# Patient Record
Sex: Male | Born: 1980 | Race: Black or African American | Hispanic: No | Marital: Married
Health system: Southern US, Community
[De-identification: ages and names within clinical notes are randomized; demographics above are authoritative.]

---

## 2005-06-24 ENCOUNTER — Emergency Department (HOSPITAL_COMMUNITY): Admission: EM | Admit: 2005-06-24 | Discharge: 2005-06-25 | Payer: Self-pay | Admitting: Emergency Medicine

## 2008-10-18 ENCOUNTER — Emergency Department (HOSPITAL_COMMUNITY): Admission: EM | Admit: 2008-10-18 | Discharge: 2008-10-18 | Payer: Self-pay | Admitting: Emergency Medicine

## 2013-01-22 ENCOUNTER — Ambulatory Visit
Admission: RE | Admit: 2013-01-22 | Discharge: 2013-01-22 | Disposition: A | Discharge status: Home / Self Care | Payer: No Typology Code available for payment source | Source: Ambulatory Visit | Attending: Infectious Disease | Admitting: Infectious Disease

## 2013-01-22 ENCOUNTER — Other Ambulatory Visit: Payer: Self-pay | Admitting: Infectious Disease

## 2013-01-22 DIAGNOSIS — A150 Tuberculosis of lung: Secondary | ICD-10-CM

## 2015-09-07 ENCOUNTER — Other Ambulatory Visit: Payer: Self-pay | Admitting: Infectious Disease

## 2015-09-07 ENCOUNTER — Ambulatory Visit
Admission: RE | Admit: 2015-09-07 | Discharge: 2015-09-07 | Disposition: A | Discharge status: Home / Self Care | Payer: No Typology Code available for payment source | Source: Ambulatory Visit | Attending: Infectious Disease | Admitting: Infectious Disease

## 2015-09-07 DIAGNOSIS — R7611 Nonspecific reaction to tuberculin skin test without active tuberculosis: Secondary | ICD-10-CM

## 2016-02-10 ENCOUNTER — Encounter (HOSPITAL_COMMUNITY): Payer: Self-pay | Admitting: Emergency Medicine

## 2016-02-10 ENCOUNTER — Emergency Department (HOSPITAL_COMMUNITY)
Admission: EM | Admit: 2016-02-10 | Discharge: 2016-02-11 | Disposition: A | Discharge status: Home / Self Care | Payer: Self-pay | Attending: Emergency Medicine | Admitting: Emergency Medicine

## 2016-02-10 DIAGNOSIS — Y999 Unspecified external cause status: Secondary | ICD-10-CM | POA: Insufficient documentation

## 2016-02-10 DIAGNOSIS — S01112A Laceration without foreign body of left eyelid and periocular area, initial encounter: Secondary | ICD-10-CM | POA: Insufficient documentation

## 2016-02-10 DIAGNOSIS — Y929 Unspecified place or not applicable: Secondary | ICD-10-CM | POA: Insufficient documentation

## 2016-02-10 DIAGNOSIS — W500XXA Accidental hit or strike by another person, initial encounter: Secondary | ICD-10-CM | POA: Insufficient documentation

## 2016-02-10 DIAGNOSIS — Z23 Encounter for immunization: Secondary | ICD-10-CM | POA: Insufficient documentation

## 2016-02-10 DIAGNOSIS — Y9367 Activity, basketball: Secondary | ICD-10-CM | POA: Insufficient documentation

## 2016-02-10 MED ORDER — TETANUS-DIPHTH-ACELL PERTUSSIS 5-2.5-18.5 LF-MCG/0.5 IM SUSP
0.5000 mL | Freq: Once | INTRAMUSCULAR | Status: AC
  Administered 2016-02-10: 0.5 mL via INTRAMUSCULAR
  Filled 2016-02-10: qty 0.5

## 2016-02-10 MED ORDER — LIDOCAINE-EPINEPHRINE-TETRACAINE (LET) SOLUTION
3.0000 mL | Freq: Once | NASAL | Status: AC
  Administered 2016-02-10: 3 mL via TOPICAL
  Filled 2016-02-10: qty 3

## 2016-02-10 NOTE — ED Triage Notes (Signed)
Pt states that he got head-butted playing basketball tonight and now has a lac above his L eye. Last tetanus in approx. 2010. Alert and oriented. Bleeding controlled.

## 2016-02-11 NOTE — ED Provider Notes (Signed)
[facility]-EMERGENCY DEPT Provider Note   CSN: [id number] Arrival date & time: 02/10/16  2213     History   Chief Complaint Chief Complaint  Patient presents with  . Facial Laceration    HPI Wesley Arellano is a 35 y.o. male.  HPI     Pt is a 35 y/o male presents to the ER with left eyebrow laceration that occurred just PTA when he was playing basketball with friends when he accidentally bumped heads with a friend.  He states the impact was mild, he had no pain, and only realized he had an injury because he felt the warm blood on his face.  He denies LOC.  Denies any other injury or pain.   Bleeding controlled at presentation.  No swelling, eye pain, blurry vision.  History reviewed. No pertinent past medical history.  There are no active problems to display for this patient.   No past surgical history on file.     Home Medications    Prior to Admission medications   Not on File    Family History History reviewed. No pertinent family history.  Social History Social History  Substance Use Topics  . Smoking status: Not on file  . Smokeless tobacco: Not on file  . Alcohol use Not on file     Allergies   Review of patient's allergies indicates no known allergies.   Review of Systems Review of Systems  All other systems reviewed and are negative.    Physical Exam Updated Vital Signs BP 146/91 (BP Location: Right Arm)   Pulse 102   Temp 98.6 F (37 C) (Oral)   Resp 18   SpO2 99%   Physical Exam  Constitutional: He is oriented to person, place, and time. He appears well-developed and well-nourished. No distress.  HENT:  Head: Normocephalic.  Right Ear: External ear normal.  Left Ear: External ear normal.  Nose: Nose normal.  Mouth/Throat: Oropharynx is clear and moist. No oropharyngeal exudate.  Eyes: Conjunctivae and EOM are normal. Pupils are equal, round, and reactive to light. Right eye exhibits no discharge. Left eye exhibits no discharge. No  scleral icterus.    2.5 cm laceration at lateral edge of left eyebrow, minimally gaping, no bleeding.  Neck: Normal range of motion. Neck supple. No JVD present. No tracheal deviation present.  Cardiovascular: Normal rate and regular rhythm.   Pulmonary/Chest: Effort normal and breath sounds normal. No stridor. No respiratory distress.  Musculoskeletal: Normal range of motion. He exhibits no edema.  Lymphadenopathy:    He has no cervical adenopathy.  Neurological: He is alert and oriented to person, place, and time. He exhibits normal muscle tone. Coordination normal.  Skin: Skin is warm and dry. No rash noted. He is not diaphoretic. No erythema. No pallor.  Psychiatric: He has a normal mood and affect. His behavior is normal. Judgment and thought content normal.  Nursing note and vitals reviewed.    ED Treatments / Results  Labs (all labs ordered are listed, but only abnormal results are displayed) Labs Reviewed - No data to display  EKG  EKG Interpretation None       Radiology No results found.  Procedures Procedures (including critical care time)  LACERATION REPAIR Performed by: Danelle BerryLeisa Remmy Riffe Consent: Verbal consent obtained. Risks and benefits: risks, benefits and alternatives were discussed Patient identity confirmed: provided demographic data Time out performed prior to procedure Prepped and Draped in normal sterile fashion Wound explored Laceration Location: left eyebrow Laceration Length: 2.5 cm  No Foreign Bodies seen or palpated Anesthesia: topical Local anesthetic:  LET (see MAR) Anesthetic total: 3 ml Irrigation method: syringe Amount of cleaning: standard Skin closure: 5.0 vicryl rapide Number of sutures or staples: 6  Technique: simple interrupted Patient tolerance: Patient tolerated the procedure well with no immediate complications.   Medications Ordered in ED Medications  Tdap (BOOSTRIX) injection 0.5 mL (0.5 mLs Intramuscular Given 02/10/16  2311)  lidocaine-EPINEPHrine-tetracaine (LET) solution (3 mLs Topical Given 02/10/16 2310)     Initial Impression / Assessment and Plan / ED Course  I have reviewed the triage vital signs and the nursing notes.  Pertinent labs & imaging results that were available during my care of the patient were reviewed by me and considered in my medical decision making (see chart for details).  Clinical Course  Mild head bump with friend while playing basketball, pt sustained left eyebrow laceration, mild bleeding controlled at presentation.  Tdap booster given.  Pressure irrigation performed. Laceration occurred < 8 hours prior to repair which was well tolerated. Pt has no co morbidities to effect normal wound healing. Discussed suture home care w pt and answered questions. Pt to f-u for wound check and suture removal in 7 days. Pt is hemodynamically stable w no complaints prior to dc.     Final Clinical Impressions(s) / ED Diagnoses   Final diagnoses:  Eyebrow laceration, left, initial encounter    New Prescriptions New Prescriptions   No medications on file     Danelle Berry, PA-C 02/24/16 0345    Danelle Berry, PA-C 02/24/16 0350    Vanetta Mulders, MD 02/25/16 1542

## 2016-02-16 ENCOUNTER — Ambulatory Visit (HOSPITAL_COMMUNITY)
Admission: EM | Admit: 2016-02-16 | Discharge: 2016-02-16 | Disposition: A | Discharge status: Home / Self Care | Payer: Self-pay

## 2016-02-16 ENCOUNTER — Encounter (HOSPITAL_COMMUNITY): Payer: Self-pay | Admitting: *Deleted

## 2016-02-16 DIAGNOSIS — Z4802 Encounter for removal of sutures: Secondary | ICD-10-CM

## 2016-02-16 DIAGNOSIS — S01112D Laceration without foreign body of left eyelid and periocular area, subsequent encounter: Secondary | ICD-10-CM

## 2016-02-16 NOTE — ED Provider Notes (Signed)
HPI  SUBJECTIVE:  Wesley Arellano is a [age] y.o. male who presents for suture removal status post left eyebrow laceration sustained 6 days ago. Patient was seen in the ED and had 6 interrupted Vicryl sutures placed. No aggravating or alleviating factors. He has been using soap and water and antibiotic ointment on this. Patient has no complaints and states that it is healing well . No redness, pain, drainage, swelling, fevers. Past medical history negative for diabetes, hypertension. PMD: None.   History reviewed. No pertinent past medical history.  History reviewed. No pertinent surgical history.  History reviewed. No pertinent family history.  Social History  Substance Use Topics  . Smoking status: Not on file  . Smokeless tobacco: Not on file  . Alcohol use Not on file    No current facility-administered medications for this encounter.  No current outpatient prescriptions on file.  No Known Allergies   ROS  As noted in HPI.   Physical Exam  BP 112/70 (BP Location: Right Arm)   Pulse 78   Temp 98.6 F (37 C)   Resp 18   SpO2 100%   Constitutional: Well developed, well nourished, no acute distress Eyes:  EOMI, conjunctiva normal bilaterally HENT: Normocephalic, Well-healed laceration left eyebrow with 6 sutures intact. No expressible drainage, erythema, tenderness.  Respiratory: Normal inspiratory effort Cardiovascular: Normal rate GI: nondistended skin: No rash, skin intact Musculoskeletal: no deformities Neurologic: Alert & oriented x 3, no focal neuro deficits Psychiatric: Speech and behavior appropriate   ED Course   Medications - No data to display  No orders of the defined types were placed in this encounter.   No results found for this or any previous visit (from the past 24 hour(s)). No results found.  ED Clinical Impression  Visit for suture removal  Laceration of left eyebrow, subsequent encounter   ED Assessment/Plan  Procedure note:  Area appears completely healed. Cleaned area with alcohol.  removed 6 interrupted sutures in their entirety without complications. Patient tolerated procedure well.  No orders of the defined types were placed in this encounter.   *This clinic note was created using Dragon dictation software. Therefore, there may be occasional mistakes despite careful proofreading.  ?   Domenick GongAshley Donielle Radziewicz, MD 02/16/16 1734

## 2016-02-16 NOTE — ED Triage Notes (Signed)
Pt is   Here    For     Suture       Removal     Appears  Well healing       Sutures  Have  Been   In place      For 6  Days

## 2016-02-16 NOTE — Discharge Instructions (Signed)
Follow-up with primary care physician of your choice for routine care. See list below.   [facility] [phone] [address] [place] [address]  [facility]: [address] [place] [id number]  [phone]  [facility]: [address] [place] [id number]   [phone]  [facility]: [address] [place] [address]  [phone]  [facility] : [address] [place] [id number] [phone]  [facility]: [address] [place] [phone]  [facility]: [address] [place] [id number] [phone]  Dr. Oneal GroutMahima Pandey [address] [facility] [place] [id number]  [phone]  Dr. Jackie PlumGeorge Osei-Bonsu, [facility]. [address] [place] [address]  [phone]) [phone]

## 2017-12-23 IMAGING — CR DG CHEST 2V
2 series · 2 of 2 positions shown · non-contrast
Comparison: 01/22/2013

CLINICAL DATA: Positive TB test

EXAM:
CHEST  2 VIEW

[w chest pa]
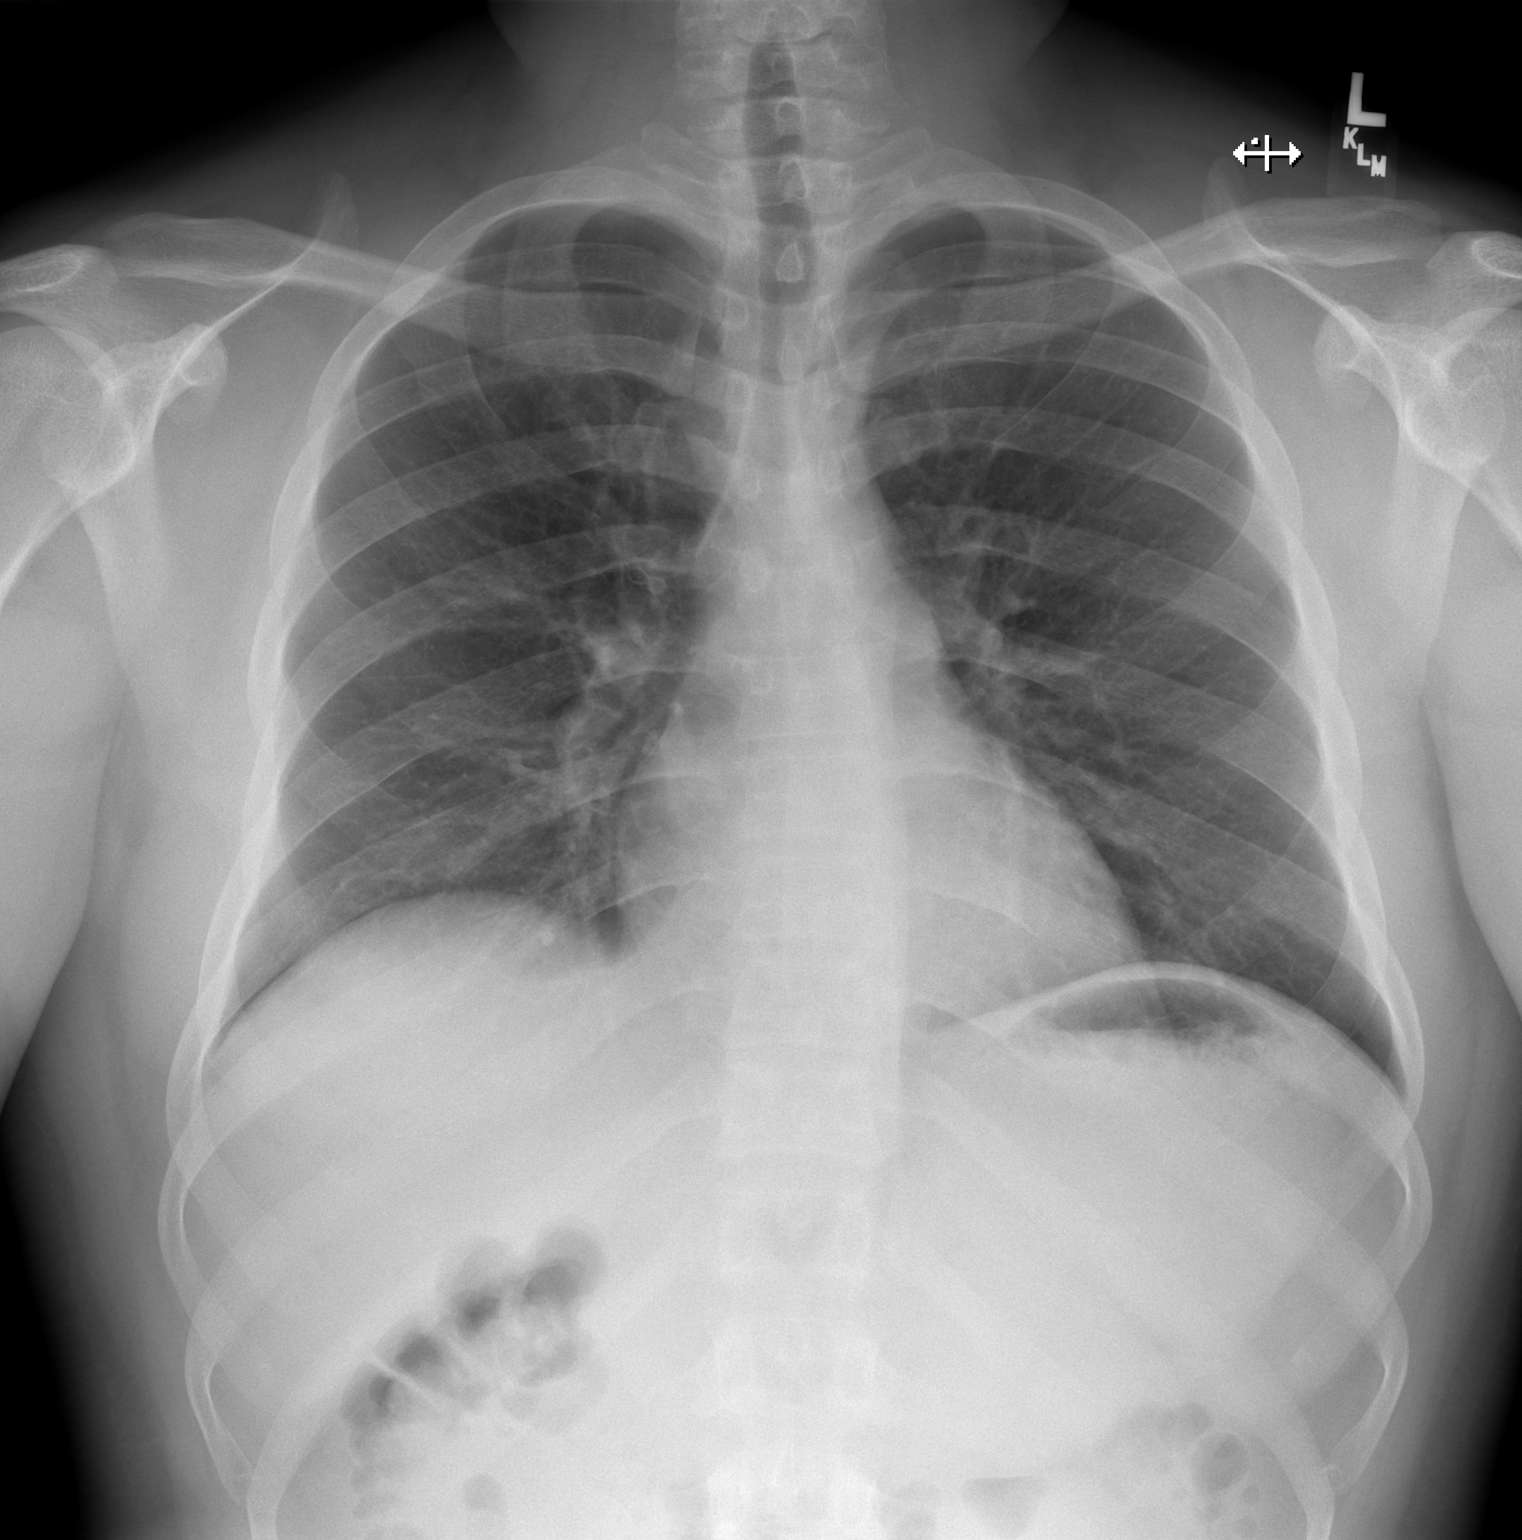

[w chest lat]
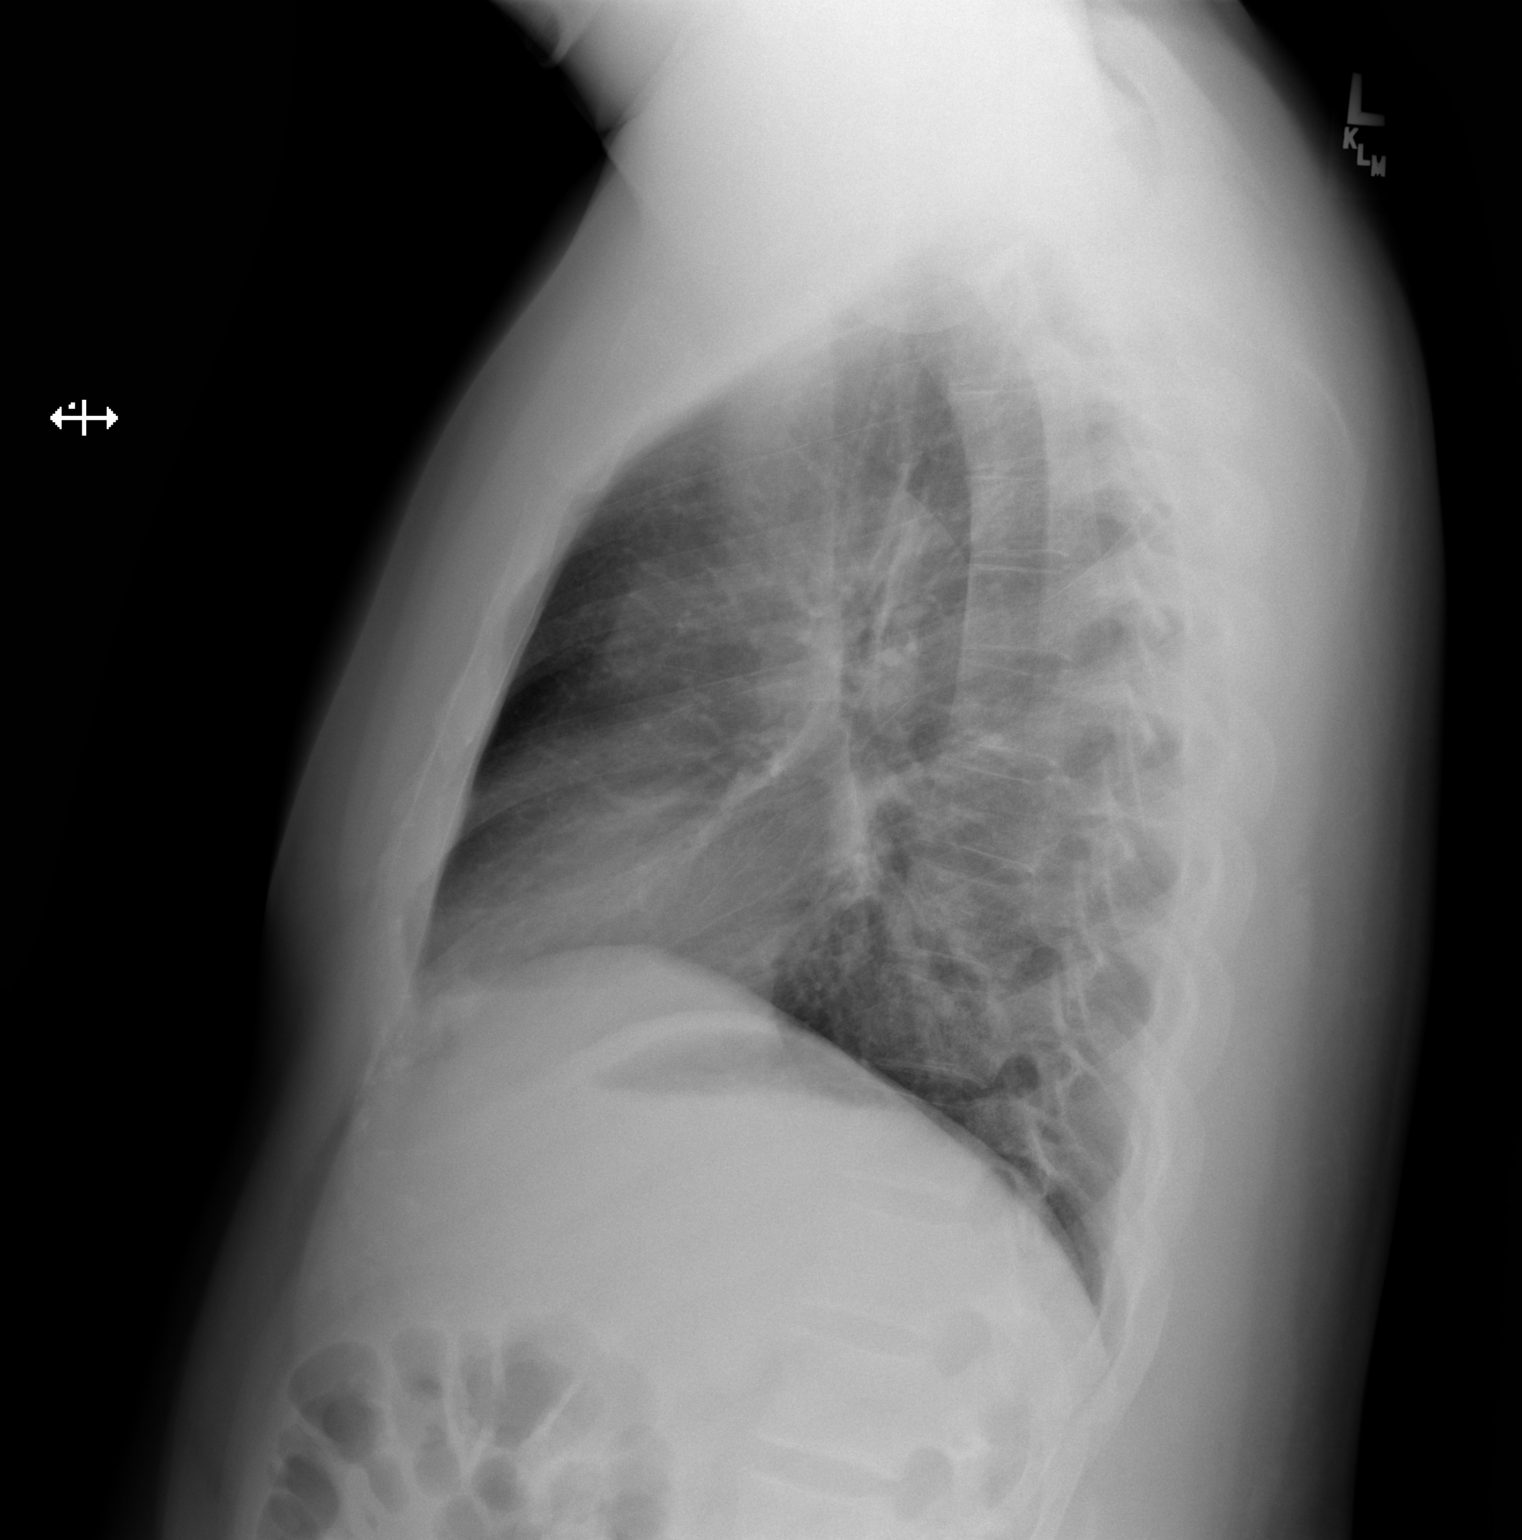

[2 of 2 positions shown; findings below may reference images not displayed]

FINDINGS: The heart size and mediastinal contours are within normal limits.
Both lungs are clear. The visualized skeletal structures are
unremarkable.
IMPRESSION: No active cardiopulmonary disease. No evidence for prior
granulomatous disease.

## 2019-02-20 ENCOUNTER — Other Ambulatory Visit: Payer: Self-pay

## 2019-02-20 DIAGNOSIS — Z20822 Contact with and (suspected) exposure to covid-19: Secondary | ICD-10-CM

## 2019-02-21 LAB — NOVEL CORONAVIRUS, NAA: SARS-CoV-2, NAA: NOT DETECTED

## 2019-03-25 ENCOUNTER — Other Ambulatory Visit: Payer: Self-pay

## 2019-03-25 DIAGNOSIS — Z20822 Contact with and (suspected) exposure to covid-19: Secondary | ICD-10-CM

## 2019-03-26 LAB — NOVEL CORONAVIRUS, NAA: SARS-CoV-2, NAA: NOT DETECTED
